# Patient Record
Sex: Male | Born: 1985 | Race: Black or African American | Hispanic: No | Marital: Single | Smoking: Former smoker
Health system: Southern US, Community
[De-identification: ages and names within clinical notes are randomized; demographics above are authoritative.]

---
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
## 2016-05-16 ENCOUNTER — Emergency Department (HOSPITAL_COMMUNITY)
Admission: EM | Admit: 2016-05-16 | Discharge: 2016-05-16 | Disposition: A | Discharge status: Home / Self Care | Payer: Self-pay | Attending: Emergency Medicine | Admitting: Emergency Medicine

## 2016-05-16 ENCOUNTER — Encounter (HOSPITAL_COMMUNITY): Payer: Self-pay

## 2016-05-16 ENCOUNTER — Emergency Department (HOSPITAL_COMMUNITY): Payer: Self-pay

## 2016-05-16 DIAGNOSIS — S93602A Unspecified sprain of left foot, initial encounter: Secondary | ICD-10-CM | POA: Insufficient documentation

## 2016-05-16 DIAGNOSIS — Y9389 Activity, other specified: Secondary | ICD-10-CM | POA: Insufficient documentation

## 2016-05-16 DIAGNOSIS — Y92511 Restaurant or cafe as the place of occurrence of the external cause: Secondary | ICD-10-CM | POA: Insufficient documentation

## 2016-05-16 DIAGNOSIS — Y990 Civilian activity done for income or pay: Secondary | ICD-10-CM | POA: Insufficient documentation

## 2016-05-16 DIAGNOSIS — X58XXXA Exposure to other specified factors, initial encounter: Secondary | ICD-10-CM | POA: Insufficient documentation

## 2016-05-16 DIAGNOSIS — Z87891 Personal history of nicotine dependence: Secondary | ICD-10-CM | POA: Insufficient documentation

## 2016-05-16 NOTE — ED Triage Notes (Signed)
Pt has ambulatory job.  Lots of walking.  2 days ago noted pain in left foot.  Not getting better.  Pt using RICE and meds with no relief.

## 2016-05-16 NOTE — Discharge Instructions (Signed)
Please read attached information. If you experience any new or worsening signs or symptoms please return to the emergency room for evaluation. Please follow-up with your primary care provider or specialist as discussed.  °

## 2016-05-16 NOTE — ED Provider Notes (Signed)
[facility]-EMERGENCY DEPT Provider Note   CSN: [id number] Arrival date & time: 05/16/16  1140  By signing my name below, I, Cody Riley, attest that this documentation has been prepared under the direction and in the presence of Newell RubbermaidJeffrey Florence Antonelli, PA-C. Electronically Signed: Phillis HaggisGabriella Riley, ED Scribe. 05/16/16. 12:25 PM.  History   Chief Complaint Chief Complaint  Patient presents with  . Foot Pain   The history is provided by the patient. No language interpreter was used.   HPI Comments: Cody Riley is a 30 y.o. male who presents to the Emergency Department complaining of sudden onset, gradually worsening lateral left foot pain onset 2 days ago. Pt states that he works at [employer] and is constantly on his feet. He was at work when he began to experience sharp foot pain. He reports that he typically works about 5 days a week and will have occasional foot pain, but never this severe. He has tried RICE protocol and Advil for his symptoms to no relief. He denies recent fall or injury, numbness, or weakness.   History reviewed. No pertinent past medical history.  There are no active problems to display for this patient.   History reviewed. No pertinent surgical history.   Home Medications    Prior to Admission medications   Not on File    Family History History reviewed. No pertinent family history.  Social History Social History  Substance Use Topics  . Smoking status: Former Games developermoker  . Smokeless tobacco: Never Used  . Alcohol use Yes     Comment: social     Allergies   Review of patient's allergies indicates no known allergies.   Review of Systems Review of Systems  All other systems reviewed and are negative.  Physical Exam Updated Vital Signs BP 121/77 (BP Location: Right Arm)   Pulse 63   Temp 98.4 F (36.9 C) (Oral)   Resp 16   SpO2 100%   Physical Exam  Constitutional: He is oriented to person, place, and time. He appears well-developed and  well-nourished.  HENT:  Head: Normocephalic and atraumatic.  Eyes: Conjunctivae are normal. Pupils are equal, round, and reactive to light.  Neck: Normal range of motion.  Abdominal: Soft. He exhibits no distension.  Musculoskeletal: Normal range of motion. He exhibits tenderness. He exhibits no edema.       Left foot: There is tenderness.  No swelling or edema of the left foot; TTP of the left lateral aspect of the base of the fifth metatarsal extending dorsally towards the mid-foot; remainder of foot atraumatic and non-tender  Neurological: He is alert and oriented to person, place, and time.  Skin: Skin is warm and dry. Capillary refill takes less than 2 seconds.  Psychiatric: He has a normal mood and affect.  Nursing note and vitals reviewed.  ED Treatments / Results  DIAGNOSTIC STUDIES: Oxygen Saturation is 100% on RA, normal by my interpretation.    COORDINATION OF CARE: 12:23 PM-Discussed treatment plan which includes x-ray from  with pt at bedside and pt agreed to plan.    Labs (all labs ordered are listed, but only abnormal results are displayed) Labs Reviewed - No data to display  EKG  EKG Interpretation None       Radiology Dg Foot Complete Left  Result Date: 05/16/2016 CLINICAL DATA:  30 year old male with lateral left foot pain for the past 3 days. No known injury. EXAM: LEFT FOOT - COMPLETE 3+ VIEW COMPARISON:  None. FINDINGS: There is no  evidence of fracture or dislocation. There is no evidence of arthropathy or other focal bone abnormality. Soft tissues are unremarkable. IMPRESSION: Negative. Electronically Signed   By: Malachy Moan M.D.   On: 05/16/2016 13:00    Procedures Procedures (including critical care time)  Medications Ordered in ED Medications - No data to display   Initial Impression / Assessment and Plan / ED Course  I have reviewed the triage vital signs and the nursing notes.  Pertinent labs & imaging results that were available  during my care of the patient were reviewed by me and considered in my medical decision making (see chart for details).  Clinical Course    Final Clinical Impressions(s) / ED Diagnoses   Final diagnoses:  Foot sprain, left, initial encounter   Labs:  Imaging:  Dg Foot Complete Left  Result Date: 05/16/2016 CLINICAL DATA:  31 year old male with lateral left foot pain for the past 3 days. No known injury. EXAM: LEFT FOOT - COMPLETE 3+ VIEW COMPARISON:  None. FINDINGS: There is no evidence of fracture or dislocation. There is no evidence of arthropathy or other focal bone abnormality. Soft tissues are unremarkable. IMPRESSION: Negative. Electronically Signed   By: Malachy Moan M.D.   On: 05/16/2016 13:00   Consults:  Therapeutics:  Discharge Meds:   Assessment/Plan: Patient X-Ray negative for obvious fracture or dislocation. Pain managed in ED. Pt advised to follow up with orthopedics if symptoms persist. ACE wrap applied in ED. Conservative therapy recommended and discussed. Patient will be dc home & is agreeable with above plan. I personally performed the services described in this documentation, which was scribed in my presence. The recorded information has been reviewed and is accurate. New Prescriptions There are no discharge medications for this patient.    Eyvonne Mechanic, PA-C 05/16/16 1406    Nelva Nay, MD 05/16/16 [phone]

## 2016-08-03 ENCOUNTER — Emergency Department (HOSPITAL_COMMUNITY)
Admission: EM | Admit: 2016-08-03 | Discharge: 2016-08-03 | Disposition: A | Discharge status: Home / Self Care | Payer: No Typology Code available for payment source | Attending: Emergency Medicine | Admitting: Emergency Medicine

## 2016-08-03 ENCOUNTER — Emergency Department (HOSPITAL_COMMUNITY): Payer: No Typology Code available for payment source

## 2016-08-03 ENCOUNTER — Encounter (HOSPITAL_COMMUNITY): Payer: Self-pay | Admitting: Emergency Medicine

## 2016-08-03 DIAGNOSIS — Z87891 Personal history of nicotine dependence: Secondary | ICD-10-CM | POA: Insufficient documentation

## 2016-08-03 DIAGNOSIS — Y999 Unspecified external cause status: Secondary | ICD-10-CM | POA: Insufficient documentation

## 2016-08-03 DIAGNOSIS — Y92410 Unspecified street and highway as the place of occurrence of the external cause: Secondary | ICD-10-CM | POA: Diagnosis not present

## 2016-08-03 DIAGNOSIS — Y939 Activity, unspecified: Secondary | ICD-10-CM | POA: Insufficient documentation

## 2016-08-03 DIAGNOSIS — T148XXA Other injury of unspecified body region, initial encounter: Secondary | ICD-10-CM

## 2016-08-03 DIAGNOSIS — S39012A Strain of muscle, fascia and tendon of lower back, initial encounter: Secondary | ICD-10-CM | POA: Insufficient documentation

## 2016-08-03 DIAGNOSIS — S3992XA Unspecified injury of lower back, initial encounter: Secondary | ICD-10-CM | POA: Diagnosis present

## 2016-08-03 MED ORDER — NAPROXEN 500 MG PO TABS
500.0000 mg | ORAL_TABLET | Freq: Once | ORAL | Status: AC
  Administered 2016-08-03: 500 mg via ORAL
  Filled 2016-08-03: qty 1

## 2016-08-03 MED ORDER — NAPROXEN 500 MG PO TABS: 500.0000 mg | ORAL_TABLET | Freq: Two times a day (BID) | ORAL | 0 refills | Status: AC

## 2016-08-03 MED ORDER — CYCLOBENZAPRINE HCL 10 MG PO TABS: 10.0000 mg | ORAL_TABLET | Freq: Two times a day (BID) | ORAL | 0 refills | Status: AC | PRN
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
## 2020-05-13 ENCOUNTER — Other Ambulatory Visit: Payer: Self-pay

## 2020-05-13 DIAGNOSIS — Z20822 Contact with and (suspected) exposure to covid-19: Secondary | ICD-10-CM

## 2020-05-15 LAB — SARS-COV-2, NAA 2 DAY TAT

## 2020-05-15 LAB — NOVEL CORONAVIRUS, NAA: SARS-CoV-2, NAA: NOT DETECTED

## 2021-10-27 ENCOUNTER — Ambulatory Visit (HOSPITAL_COMMUNITY)
Admission: RE | Admit: 2021-10-27 | Discharge: 2021-10-27 | Disposition: A | Discharge status: Home / Self Care | Payer: Self-pay | Attending: Psychiatry | Admitting: Psychiatry

## 2021-10-27 NOTE — BH Assessment (Addendum)
TTS assessment complete. Per [facility] provider Elta Guadeloupe, NP), patient is psych cleared to discharge home. He is recommended to follow up with a Intensive Outpatient Program that would meet his needs (Partial, Psych IOP, CDIOP, etc.). Patient was agreeable to follow up with referrals given to him and agreeable to seek services at a program that will work within in work schedule. He was given referrals to the [facility] outpatient department on [address], [address], [facility] [address], and [employer].

## 2021-10-27 NOTE — BH Assessment (Addendum)
Comprehensive Clinical Assessment (CCA) Note  10/27/2021 ESTA RANFT VA:[id number]  Disposition: TTS assessment complete. Per [facility] provider Jinny Blossom, NP), patient is psych cleared to discharge home. He is recommended to follow up with a Intensive Outpatient Program that would meet his needs (Partial, Psych IOP, CDIOP, etc.). Patient was agreeable to follow up with referrals given to him and agreeable to seek services at a program that will work within in work schedule. He was given referrals to the [facility] outpatient department on [place], [place], [place], and [place].  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 States that someone he knows recommended that he present to [facility] for a assessment due to his increased depressive symptoms and anxiety.  He reports multiple stressors and is seeking options for treatment.  Denies current suicidal thoughts. He last had suicidal thoughts when he was 40 or 36 yrs old. He overdosed on medications during that time and this event was triggered by "a bad relationship" and "separation from family". Denies hx of self-injurious behaviors. Protective factor is his 32 yr old daughter, he has joint custody of her 50/50 with the biological mother. No stressors related to the custody of his child. However, does report a lot of family stress with his mother and step father. He recently rekindled a relationship with his step father. However, his relationship with his mother is still unstable.   Patient says that he has a hx of depression, starting when he was an adolescent through [employer]. Current depressive symptoms include: guilt, worthlessness, tearful, wanting to be alone, unable to get out of the bed. He reports poor sleep and  appetite. He is unsure of a family hx of mental health illness outside of knowing that his sister was admitted to [facility] in the past.  Patient denies homicidal ideations. Denies hx of aggressive and/or assaultive Denies legal issues. Denies AVH's. He reports occasional use of alcohol, more so when patient feels depressed or anxious. Patient reports use of THC, daily. Denies that he was ever seen a psychiatrist/therapist.   CCA Screening, Triage and Referral (STR)  Patient Reported Information How did you hear about [place]? Family/Friend  What Is the Reason for Your Visit/Call Today? Cody Riley is a 36 y/o male presenting to [facility], voluntarily. States that someone he knows recommended that he present to [facility] for a assesment of his increased depresssive symptoms and anxiety.  He reports multiple stressors and is seeking options for treatment.  How Long Has This Been Causing You Problems? > than 6 months  What Do You Feel Would Help You the Most Today? Treatment for Depression or other mood problem   Have You Recently Had Any Thoughts About Hurting Yourself? No  Are You Planning to Commit Suicide/Harm Yourself At This time? No   Have you Recently Had Thoughts About [place]? No  Are You Planning to Harm Someone at This Time? No  Explanation: No data recorded  Have You Used Any Alcohol or Drugs in the Past 24 Hours? No  How Long Ago Did You Use Drugs or Alcohol? No data recorded What Did You Use and How Much? No data recorded  Do You Currently Have a Therapist/Psychiatrist? No  Name of Therapist/Psychiatrist: No data recorded  Have You Been Recently Discharged From Any  Mud[employer] or Programs? No  Explanation of Discharge From Practice/Program: No data recorded    CCA Screening Triage Referral Assessment Type of Contact: Tele-Assessment  Telemedicine Service Delivery: Telemedicine service delivery: This service was provided via telemedicine using a 2-way, interactive  audio and video technology  Is this Initial or Reassessment? Initial Assessment  Date Telepsych consult ordered in [facility]:  10/27/21  Time Telepsych consult ordered in [facility]:  No data recorded Location of Assessment: [facility]  Provider Location: [facility]   Collateral Involvement: No data recorded  Does Patient Have a [place]? No data recorded Name and Contact of Legal Guardian: No data recorded If Minor and Not Living with Parent(s), Who has Custody? No data recorded Is CPS involved or ever been involved? Never  Is APS involved or ever been involved? Never   Patient Determined To Be At Risk for Harm To Self or Others Based on Review of Patient Reported Information or Presenting Complaint? No  Method: No data recorded Availability of Means: No data recorded Intent: No data recorded Notification Required: No data recorded Additional Information for Danger to Others Potential: No data recorded Additional Comments for Danger to Others Potential: No data recorded Are There Guns or Other Weapons in Your Home? No data recorded Types of Guns/Weapons: No data recorded Are These Weapons Safely Secured?                            No data recorded Who Could Verify You Are Able To Have These Secured: No data recorded Do You Have any Outstanding Charges, Pending Court Dates, Parole/Probation? No data recorded Contacted To Inform of Risk of Harm To Self or Others: No data recorded   Does Patient Present under Involuntary Commitment? No  IVC Papers Initial File Date: No data recorded  [place] of Residence: [place]   Patient Currently Receiving the Following Services: Medication Management (Patient has no psychiatric services in place at this time.)   Determination of Need: Routine (7 days)   Options For Referral: Medication Management; Intensive Outpatient Therapy; Chemical Dependency Intensive Outpatient Therapy (CDIOP)     CCA  Biopsychosocial Patient Reported Schizophrenia/Schizoaffective Diagnosis in Past: No   Strengths: Calm and Cooperative. Motivated to help with with symptoms.   Mental Health Symptoms Depression:   Irritability; Hopelessness; Tearfulness; Change in energy/activity; Difficulty Concentrating   Duration of Depressive symptoms:  Duration of Depressive Symptoms: Greater than two weeks   Mania:   None   Anxiety:    Difficulty concentrating; Tension   Psychosis:   None   Duration of Psychotic symptoms:    Trauma:   None   Obsessions:   None   Compulsions:   N/A   Inattention:   N/A   Hyperactivity/Impulsivity:   None   Oppositional/Defiant Behaviors:   None   Emotional Irregularity:   Mood lability   Other Mood/Personality Symptoms:  No data recorded   Mental Status Exam Appearance and self-care  Stature:   Average   Weight:   Average weight   Clothing:   Neat/clean   Grooming:   Normal   Cosmetic use:   None   Posture/gait:   Normal   Motor activity:   Not Remarkable   Sensorium  Attention:   Normal   Concentration:   Normal   Orientation:   Time; Situation; Place; Person; Object   Recall/memory:   Normal   Affect and Mood  Affect:  Depressed   Mood:   Depressed   Relating  Eye contact:   None   Facial expression:   Depressed; Anxious   Attitude toward examiner:   Cooperative   Thought and Language  Speech flow:  Clear and Coherent   Thought content:   Appropriate to Mood and Circumstances   Preoccupation:   Ruminations   Hallucinations:   None   Organization:  No data recorded  [employer] of Knowledge:   Average   Intelligence:   Average   Abstraction:   Normal   Judgement:   Normal   Reality Testing:   Adequate   Insight:   Fair   Decision Making:   Normal   Social Functioning  Social Maturity:   Responsible   Social Judgement:   Normal   Stress  Stressors:    Family conflict; Relationship (poor relationship with family (mother, father, and sisters))   Coping Ability:   Normal   Skill Deficits:   Decision making   Supports:   Support needed     Religion: Religion/Spirituality Are You A Religious Person?: No  Leisure/Recreation: Leisure / Recreation Do You Have Hobbies?: No  Exercise/Diet: Exercise/Diet Do You Exercise?: No Have You Gained or Lost A Significant Amount of Weight in the Past Six Months?: No Do You Follow a Special Diet?: No Do You Have Any Trouble Sleeping?: No   CCA Employment/Education Employment/Work Situation: Employment / Work Situation Employment Situation: Employed Work Stressors: States that he hasn't been himself at work. He reports that the is typically an upbeat person and hasn't felt like being that way at work lately. He is experiencing more isolating behaviors. Patient's Job has Been Impacted by Current Illness: Yes Describe how Patient's Job has Been Impacted: Isolating self more, not wanting to be around people as much, not as motivated to be at work. Has Patient ever Been in the [employer]?: No  Education: Education Is Patient Currently Attending School?: No Last Grade Completed:  (unknown) Did You Attend College?: No Did You Have An Individualized Education Program (IIEP): No Did You Have Any Difficulty At School?: Yes (Depressive symptoms started during young adulthood.) Were Any Medications Ever Prescribed For These Difficulties?: No Patient's Education Has Been Impacted by Current Illness: No   CCA Family/Childhood History Family and Relationship History: Family history Marital status: Single Does patient have children?: Yes How many children?:  (61 yr old) How is patient's relationship with their children?: Patient has a good relationship with his daughter. Currently has 50/50 custody.  Childhood History:  Childhood History By whom was/is the patient raised?: Mother (Mother and  step father) Did patient suffer any verbal/emotional/physical/sexual abuse as a child?: No Did patient suffer from severe childhood neglect?: No Has patient ever been sexually abused/assaulted/raped as an adolescent or adult?: No Was the patient ever a victim of a crime or a disaster?: No Witnessed domestic violence?: No Has patient been affected by domestic violence as an adult?: No  Child/Adolescent Assessment:     CCA Substance Use Alcohol/Drug Use: Alcohol / Drug Use Pain Medications: SEE MAR Prescriptions: SEE MAR Over the Counter: SEE MAR History of alcohol / drug use?: Yes Longest period of sobriety (when/how long): He reports long periods of sobriety. His drinking spells are often related to to stress and depression. Withdrawal Symptoms: None Substance #1 Name of Substance 1: Alcohol 1 - Age of First Use: 36 yrs old 1 - Amount (size/oz): 6-8 ounces 1 - Frequency: varies with mood and  stress level 1 - Duration: on-going 1 - Last Use / Amount: "Last night", 10/26/2021, 2-3 ounces of bourbon 1 - Method of Aquiring: store 1- Route of Use: oral Substance #2 Name of Substance 2: THC 2 - Age of First Use: unknown 2 - Amount (size/oz): 2 ounces per month 2 - Frequency: daily 2 - Duration: on-going 2 - Last Use / Amount: "This morning", 10/27/2021; amt varies 2 - Method of Aquiring: unknkown 2 - Route of Substance Use: inhalation                     ASAM's:  Six Dimensions of Multidimensional Assessment  Dimension 1:  Acute Intoxication and/or Withdrawal Potential:      Dimension 2:  Biomedical Conditions and Complications:      Dimension 3:  Emotional, Behavioral, or Cognitive Conditions and Complications:     Dimension 4:  Readiness to Change:     Dimension 5:  Relapse, Continued use, or Continued Problem Potential:     Dimension 6:  Recovery/Living Environment:     ASAM Severity Score:    ASAM Recommended Level of Treatment:     Substance use Disorder  (SUD) Substance Use Disorder (SUD)  Checklist Symptoms of Substance Use: Continued use despite persistent or recurrent social, interpersonal problems, caused or exacerbated by use, Social, occupational, recreational activities given up or reduced due to use  Recommendations for Services/Supports/Treatments: Recommendations for Services/Supports/Treatments Recommendations For Services/Supports/Treatments: Medication Management, Individual Therapy, CD-IOP Intensive Chemical Dependency Program, IOP (Intensive Outpatient Program), Partial Hospitalization  Discharge Disposition:    DSM5 Diagnoses: There are no problems to display for this patient.    Referrals to Alternative Service(s): Referred to Alternative Service(s):   Place:   Date:   Time:    Referred to Alternative Service(s):   Place:   Date:   Time:    Referred to Alternative Service(s):   Place:   Date:   Time:    Referred to Alternative Service(s):   Place:   Date:   Time:     Waldon Merl, Counselor

## 2021-10-27 NOTE — H&P (Signed)
Behavioral Health Medical Screening Exam  Cody Riley is a 36 y.o. male who presented as a voluntary walk-in, unaccompanied, for evaluation of depression, anxiety and substance abuse. Patient was seen, chart reviewed and case discussed with Dr Serafina Mitchell. Patient is a single parent of a 65 year old son. He shares custody with the child's mother and they have a good co-parenting relationship. He has his son every other week. 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   Patient stated he has been depressed since high school. He managed it by keeping busy and self-medicating with weed and alcohol. He stated he has waves and ebbs and flows of depression and anxiety. He stated he is tired of feeling like he is not a good person and wants help. He stated he is normally a very social person but lately spends most of his time at home and finds motivation difficult.   He endorses depressive symptoms of guilt, feeling worthless, irritability, wanting to be alone, anxiety and being unable to get out of bed. He is tearful throughout the assessment. He stated his relationship with his mother is not good and he has recently started to reconnect with his step-father. He describes his family as having a lot of "drama." He has a sister with mental health issues and she has been hospitalized at [facility] numerous times. He has 3 siblings; 2 in this area and 1 in [place]. He admits to one previous suicide attempt at age 82 or 52 by taking pills. He has never been inpatient in a psychiatric hospital. He has never seen a therapist or psychiatrist and takes no medication. He admits to drinking some alcohol about every day and smokes 2 oz of marijuana monthly.  He stated his protective factor is his son. He is alert & oriented x 4; calm & cooperative; and his mood is congruent with affect.  He appears well dressed and groomed. He is tearful and sad. He is speaking in a clear tone at moderate volume, and normal  pace; with good eye contact.  His thought process is coherent and relevant; there is no indication that he is currently responding to internal/external stimuli or experiencing delusional thought content; and he has denied suicidal/self-harm/homicidal ideation, psychosis, and paranoia.    Patient does not meet criteria for inpatient hospitalization. He is interested in outpatient resources for therapy and medication management.   Total Time spent with patient: 30 minutes  Psychiatric Specialty Exam:  Presentation  General Appearance: Appropriate for Environment; Casual; Fairly Groomed  Eye Contact:Good  Speech:Clear and Coherent; Normal Rate  Speech Volume:Normal  Handedness:Right  Mood and Affect  Mood:Depressed; Anxious  Affect:Appropriate; Congruent; Depressed  Thought Process  Thought Processes:Coherent; Goal Directed; Linear  Descriptions of Associations:Intact  Orientation:Full (Time, Place and Person)  Thought Content:Logical  History of Schizophrenia/Schizoaffective disorder:No data recorded Duration of Psychotic Symptoms:No data recorded Hallucinations:Hallucinations: None  Ideas of Reference:None  Suicidal Thoughts:Suicidal Thoughts: No  Homicidal Thoughts:Homicidal Thoughts: No  Sensorium  Memory:Immediate Good; Recent Good; Remote Good  Judgment:Good  Insight:Good  Executive Functions  Concentration:Good  Attention Span:Good  [place] of Knowledge:Good  Language:Good  Psychomotor Activity  Psychomotor Activity:Psychomotor Activity: Normal  Assets  Assets:Communication Skills; Desire for Improvement; Financial Resources/Insurance; Housing; Physical Health; Resilience; Social Support; Transportation; Vocational/Educational  Sleep  Sleep:Sleep: Poor  Physical Exam: Physical Exam Vitals reviewed.  Constitutional:      Appearance: Normal appearance.  HENT:     Head: Normocephalic.  Nose: Nose normal.  Eyes:     Pupils:  Pupils are equal, round, and reactive to light.  Pulmonary:     Effort: Pulmonary effort is normal.  Musculoskeletal:        General: Normal range of motion.     Cervical back: Normal range of motion.  Neurological:     General: No focal deficit present.     Mental Status: He is alert and oriented to person, place, and time.  Psychiatric:        Attention and Perception: Attention and perception normal. He does not perceive auditory or visual hallucinations.        Mood and Affect: Mood is anxious and depressed.        Speech: Speech normal.        Behavior: Behavior normal. Behavior is cooperative.        Thought Content: Thought content normal. Thought content is not paranoid or delusional. Thought content does not include homicidal or suicidal ideation. Thought content does not include homicidal or suicidal plan.        Cognition and Memory: Cognition normal.   Review of Systems  Constitutional: Negative.  Negative for fever.  HENT: Negative.  Negative for congestion and sore throat.   Respiratory: Negative.  Negative for cough and shortness of breath.   Cardiovascular: Negative.  Negative for chest pain.  Neurological: Negative.   Psychiatric/Behavioral:  Positive for depression and substance abuse. The patient is nervous/anxious.    Blood pressure (!) 137/91, pulse 83, temperature 98.9 F (37.2 C), temperature source Oral, resp. rate 18, SpO2 98 %. There is no height or weight on file to calculate BMI.  Musculoskeletal: Strength & Muscle Tone: within normal limits Gait & Station: normal Patient leans: N/A  Recommendations:  Based on my evaluation the patient does not appear to have an emergency medical condition.Patient was provided with outpatient resources to [facility], [place], [place], [place] and [place] for medication management and therapy follow up in the community. Patient was given strict return precautions;call 911, mobile crisis, go to  the nearest emergency room, return to [facility] or [facility], call the [place] or text [phone]. Patient verbalized understanding and left [place] under no apparent distress.    Ethelene Hal, NP 10/27/2021, 5:10 PM
